# Patient Record
Sex: Male | Born: 1980 | Race: White | Hispanic: No | Marital: Single
Health system: Southern US, Community
[De-identification: ages and names within clinical notes are randomized; demographics above are authoritative.]

## PROBLEM LIST (undated history)

## (undated) DIAGNOSIS — F319 Bipolar disorder, unspecified: Secondary | ICD-10-CM

## (undated) DIAGNOSIS — M1990 Unspecified osteoarthritis, unspecified site: Secondary | ICD-10-CM

## (undated) DIAGNOSIS — I10 Essential (primary) hypertension: Secondary | ICD-10-CM

---

## 2019-05-08 ENCOUNTER — Emergency Department (HOSPITAL_COMMUNITY): Payer: Self-pay

## 2019-05-08 ENCOUNTER — Encounter (HOSPITAL_COMMUNITY): Payer: Self-pay

## 2019-05-08 ENCOUNTER — Other Ambulatory Visit: Payer: Self-pay

## 2019-05-08 ENCOUNTER — Emergency Department (HOSPITAL_COMMUNITY)
Admission: EM | Admit: 2019-05-08 | Discharge: 2019-05-09 | Disposition: A | Discharge status: Home / Self Care | Payer: Self-pay | Attending: Emergency Medicine | Admitting: Emergency Medicine

## 2019-05-08 DIAGNOSIS — Z20828 Contact with and (suspected) exposure to other viral communicable diseases: Secondary | ICD-10-CM | POA: Insufficient documentation

## 2019-05-08 DIAGNOSIS — R0789 Other chest pain: Secondary | ICD-10-CM | POA: Insufficient documentation

## 2019-05-08 DIAGNOSIS — R05 Cough: Secondary | ICD-10-CM | POA: Insufficient documentation

## 2019-05-08 DIAGNOSIS — R0602 Shortness of breath: Secondary | ICD-10-CM | POA: Insufficient documentation

## 2019-05-08 DIAGNOSIS — J329 Chronic sinusitis, unspecified: Secondary | ICD-10-CM | POA: Insufficient documentation

## 2019-05-08 DIAGNOSIS — R059 Cough, unspecified: Secondary | ICD-10-CM

## 2019-05-08 DIAGNOSIS — I10 Essential (primary) hypertension: Secondary | ICD-10-CM | POA: Insufficient documentation

## 2019-05-08 HISTORY — DX: Essential (primary) hypertension: I10

## 2019-05-08 HISTORY — DX: Bipolar disorder, unspecified: F31.9

## 2019-05-08 HISTORY — DX: Unspecified osteoarthritis, unspecified site: M19.90

## 2019-05-08 LAB — CBC
HCT: 44.6 % (ref 39.0–52.0)
Hemoglobin: 15 g/dL (ref 13.0–17.0)
MCH: 30.7 pg (ref 26.0–34.0)
MCHC: 33.6 g/dL (ref 30.0–36.0)
MCV: 91.4 fL (ref 80.0–100.0)
Platelets: 302 10*3/uL (ref 150–400)
RBC: 4.88 MIL/uL (ref 4.22–5.81)
RDW: 11.7 % (ref 11.5–15.5)
WBC: 11.3 10*3/uL — ABNORMAL HIGH (ref 4.0–10.5)
nRBC: 0 % (ref 0.0–0.2)

## 2019-05-08 LAB — BASIC METABOLIC PANEL
Anion gap: 9 (ref 5–15)
BUN: 14 mg/dL (ref 6–20)
CO2: 30 mmol/L (ref 22–32)
Calcium: 9.3 mg/dL (ref 8.9–10.3)
Chloride: 101 mmol/L (ref 98–111)
Creatinine, Ser: 1.06 mg/dL (ref 0.61–1.24)
GFR calc Af Amer: >60 mL/min (ref >60)
GFR calc non Af Amer: >60 mL/min (ref >60)
Glucose, Bld: 101 mg/dL — ABNORMAL HIGH (ref 70–99)
Potassium: 3.7 mmol/L (ref 3.5–5.1)
Sodium: 140 mmol/L (ref 135–145)

## 2019-05-08 LAB — TROPONIN I (HIGH SENSITIVITY): Troponin I (High Sensitivity): 7 ng/L (ref <18)

## 2019-05-08 MED ORDER — SODIUM CHLORIDE 0.9% FLUSH: 3.0000 mL | Freq: Once | INTRAVENOUS | Status: DC

## 2019-05-08 NOTE — ED Triage Notes (Addendum)
Patient c/o CP that hurts with palpation. Hx of asthma and EMS states pt sounds "junky". Also c/o sinus pressure with congestion.

## 2019-05-09 LAB — TROPONIN I (HIGH SENSITIVITY): Troponin I (High Sensitivity): 6 ng/L (ref <18)

## 2019-05-09 LAB — SARS CORONAVIRUS 2 (TAT 6-24 HRS): SARS Coronavirus 2: NEGATIVE

## 2019-05-09 MED ORDER — ALBUTEROL SULFATE (2.5 MG/3ML) 0.083% IN NEBU: 2.5000 mg | INHALATION_SOLUTION | Freq: Four times a day (QID) | RESPIRATORY_TRACT | 12 refills | Status: AC | PRN

## 2019-05-09 MED ORDER — AZITHROMYCIN 250 MG PO TABS: 250.0000 mg | ORAL_TABLET | Freq: Every day | ORAL | 0 refills | Status: AC

## 2019-05-09 MED ORDER — ALBUTEROL SULFATE HFA 108 (90 BASE) MCG/ACT IN AERS
2.0000 | INHALATION_SPRAY | Freq: Once | RESPIRATORY_TRACT | Status: AC
  Administered 2019-05-09: 07:00:00 2 via RESPIRATORY_TRACT
  Filled 2019-05-09: qty 6.7

## 2019-05-09 NOTE — ED Provider Notes (Signed)
[facility] EMERGENCY DEPARTMENT Provider Note   CSN: [id number] Arrival date & time: 05/08/19  2154     History   Chief Complaint Chief Complaint  Patient presents with  . Shortness of Breath    HPI Ralph Skinner is a 38 y.o. male.     The history is provided by the patient.  Shortness of Breath Severity:  Moderate Onset quality:  Gradual Timing:  Intermittent Progression:  Improving Chronicity:  New Relieved by:  None tried Worsened by:  Nothing Associated symptoms: chest pain, cough, ear pain and sore throat   Associated symptoms: no hemoptysis and no vomiting   Patient with history of asthma, arthritis, bipolar presents with cough and shortness of breath.  He reports approximately 5 days ago he began having a cough and congestion.  He also reports associated shortness of breath.  He also reports of ear pain and congestion.  He reports while at work earlier in the night, he began feeling more short of breath, feeling feverish, and had chest pain and pressure.  He reported feeling significant fatigue and weakness.  No syncope. He is a non-smoker.  Past Medical History:  Diagnosis Date  . Arthritis   . Bipolar affective ([place])   . Hypertension     There are no active problems to display for this patient.   History reviewed. No pertinent surgical history.      Home Medications    Prior to Admission medications   Not on File    Family History No family history on file.  Social History Social History   Tobacco Use  . Smoking status: Not on file  Substance Use Topics  . Alcohol use: Not on file  . Drug use: Not on file     Allergies   Penicillins and Sulfa antibiotics   Review of Systems Review of Systems  Constitutional: Positive for fatigue.  HENT: Positive for ear pain and sore throat.   Respiratory: Positive for cough and shortness of breath. Negative for hemoptysis.   Cardiovascular: Positive for chest pain. Negative for leg  swelling.  Gastrointestinal: Negative for vomiting.  All other systems reviewed and are negative.    Physical Exam Updated Vital Signs BP (!) 165/99 (BP Location: Right Arm)   Pulse (!) 56   Temp 98.1 F (36.7 C) (Oral)   Resp 11   Ht 1.854 m (6\' 1" )   Wt 117.9 kg   SpO2 98%   BMI 34.30 kg/m   Physical Exam CONSTITUTIONAL: Well developed/well nourished HEAD: Normocephalic/atraumatic EYES: EOMI/PERRL ENMT: Mucous membranes moist NECK: supple no meningeal signs SPINE/BACK:entire spine nontender CV: S1/S2 noted, no murmurs/rubs/gallops noted LUNGS: Mildly decreased breath sounds bilaterally, no crackles, no significant wheeze.  No acute distress ABDOMEN: soft, nontender, no rebound or guarding, bowel sounds noted throughout abdomen GU:no cva tenderness NEURO: Pt is awake/alert/appropriate, moves all extremitiesx4.  No facial droop.   EXTREMITIES: pulses normal/equal, full ROM, no lower extremity edema SKIN: warm, color normal PSYCH: no abnormalities of mood noted, alert and oriented to situation   ED Treatments / Results  Labs (all labs ordered are listed, but only abnormal results are displayed) Labs Reviewed  BASIC METABOLIC PANEL - Abnormal; Notable for the following components:      Result Value   Glucose, Bld 101 (*)    All other components within normal limits  CBC - Abnormal; Notable for the following components:   WBC 11.3 (*)    All other components within normal limits  NOVEL CORONAVIRUS, NAA (HOSP ORDER, SEND-OUT TO REF LAB; TAT 18-24 HRS)  TROPONIN I (HIGH SENSITIVITY)  TROPONIN I (HIGH SENSITIVITY)    EKG EKG Interpretation  Date/Time:  Saturday May 09 2019 06:05:08 EDT Ventricular Rate:  58 PR Interval:    QRS Duration: 94 QT Interval:  412 QTC Calculation: 405 R Axis:   54 Text Interpretation:  Sinus rhythm Borderline repolarization abnormality No previous ECGs available Confirmed by Zadie Rhine ([id number]) on 05/09/2019 6:24:43 AM    Radiology Dg Chest 2 View  Result Date: 05/08/2019 CLINICAL DATA:  Chest pain EXAM: CHEST - 2 VIEW COMPARISON:  None. FINDINGS: The heart size and mediastinal contours are within normal limits. Both lungs are clear. The visualized skeletal structures are unremarkable. IMPRESSION: No active cardiopulmonary disease. Electronically Signed   By: Charlett Nose M.D.   On: 05/08/2019 22:16    Procedures Procedures    Medications Ordered in ED Medications  sodium chloride flush (NS) 0.9 % injection 3 mL (has no administration in time range)  albuterol (VENTOLIN HFA) 108 (90 Base) MCG/ACT inhaler 2 puff (has no administration in time range)     Initial Impression / Assessment and Plan / ED Course  I have reviewed the triage vital signs and the nursing notes.  Pertinent labs & imaging results that were available during my care of the patient were reviewed by me and considered in my medical decision making (see chart for details).        Patient reports recent cough and congestion for several days that he felt was a sinus infection  Earlier in the night while at work at [employer] he began having increasing cough fatigue, feeling feverish and shortness of breath. Work-up in the ER was overall unremarkable.  No hypoxia. Low suspicion for ACS/PE. Chest x-ray is negative. He is appropriate for outpatient management.  Will advise albuterol, but will also add on an antibiotic in case there is any component of bacterial sinusitis We will test for COVID-19. Discussed strict ER return precautions.  Patient be given referral to local PCP as he recently located here  Ralph Skinner was evaluated in Emergency Department on 05/09/2019 for the symptoms described in the history of present illness. He was evaluated in the context of the global COVID-19 pandemic, which necessitated consideration that the patient might be at risk for infection with the SARS-CoV-2 virus that causes COVID-19. Institutional  protocols and algorithms that pertain to the evaluation of patients at risk for COVID-19 are in a state of rapid change based on information released by regulatory bodies including the CDC and federal and state organizations. These policies and algorithms were followed during the patient's care in the ED.   Final Clinical Impressions(s) / ED Diagnoses   Final diagnoses:  SOB (shortness of breath)  Cough  Sinusitis, unspecified chronicity, unspecified location    ED Discharge Orders         Ordered    albuterol (PROVENTIL) (2.5 MG/3ML) 0.083% nebulizer solution  Every 6 hours PRN     05/09/19 0632    azithromycin (ZITHROMAX) 250 MG tablet  Daily     05/09/19 9485           Zadie Rhine, MD 05/09/19 [phone]

## 2020-02-04 ENCOUNTER — Emergency Department (HOSPITAL_COMMUNITY)
Admission: EM | Admit: 2020-02-04 | Discharge: 2020-02-04 | Discharge status: Against Medical Advice | Payer: Medicaid Other

## 2020-07-30 IMAGING — CR DG CHEST 2V
2 series · 2 of 2 positions shown · non-contrast
Comparison: None.

CLINICAL DATA: Chest pain

EXAM:
CHEST - 2 VIEW

[chest pa]
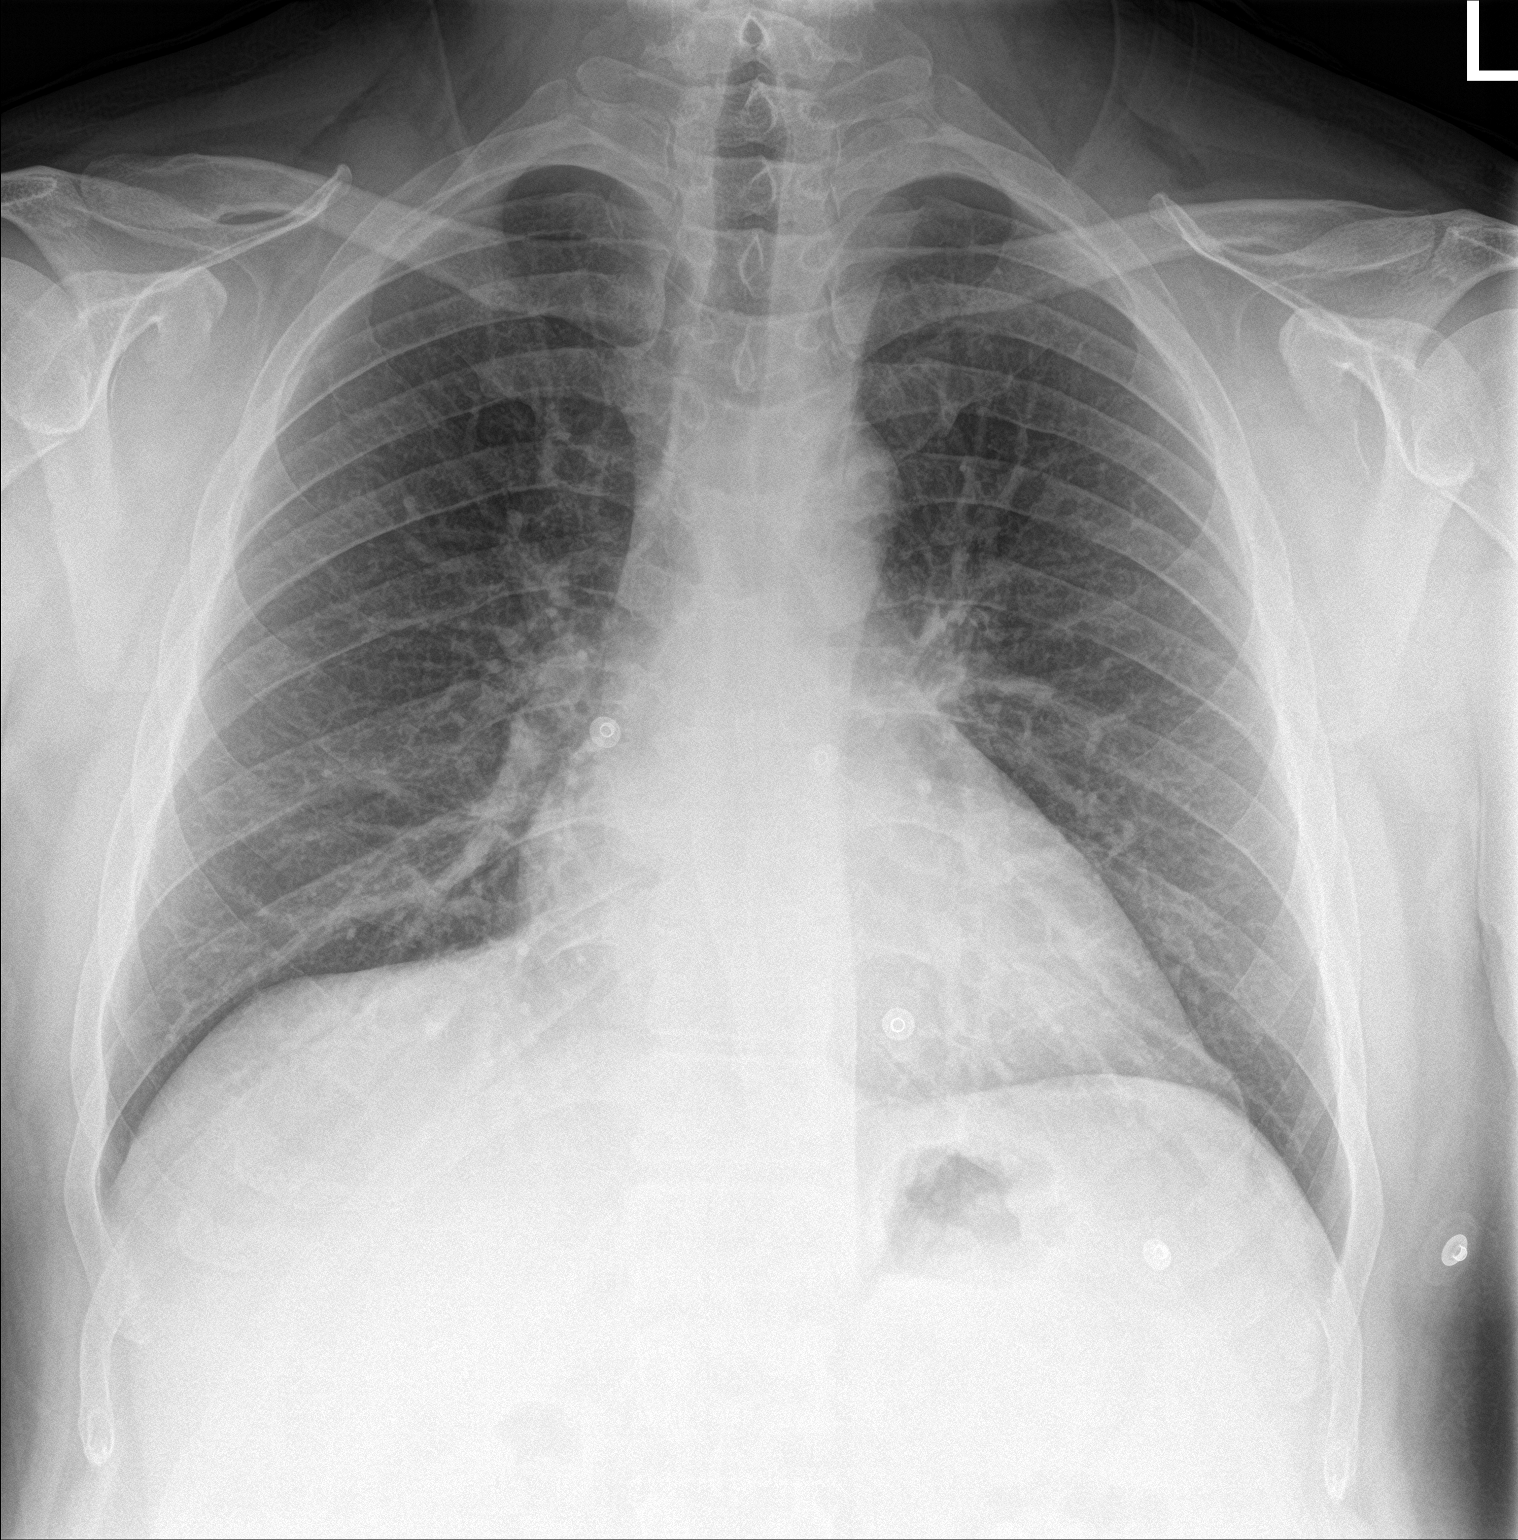

[chest lat]
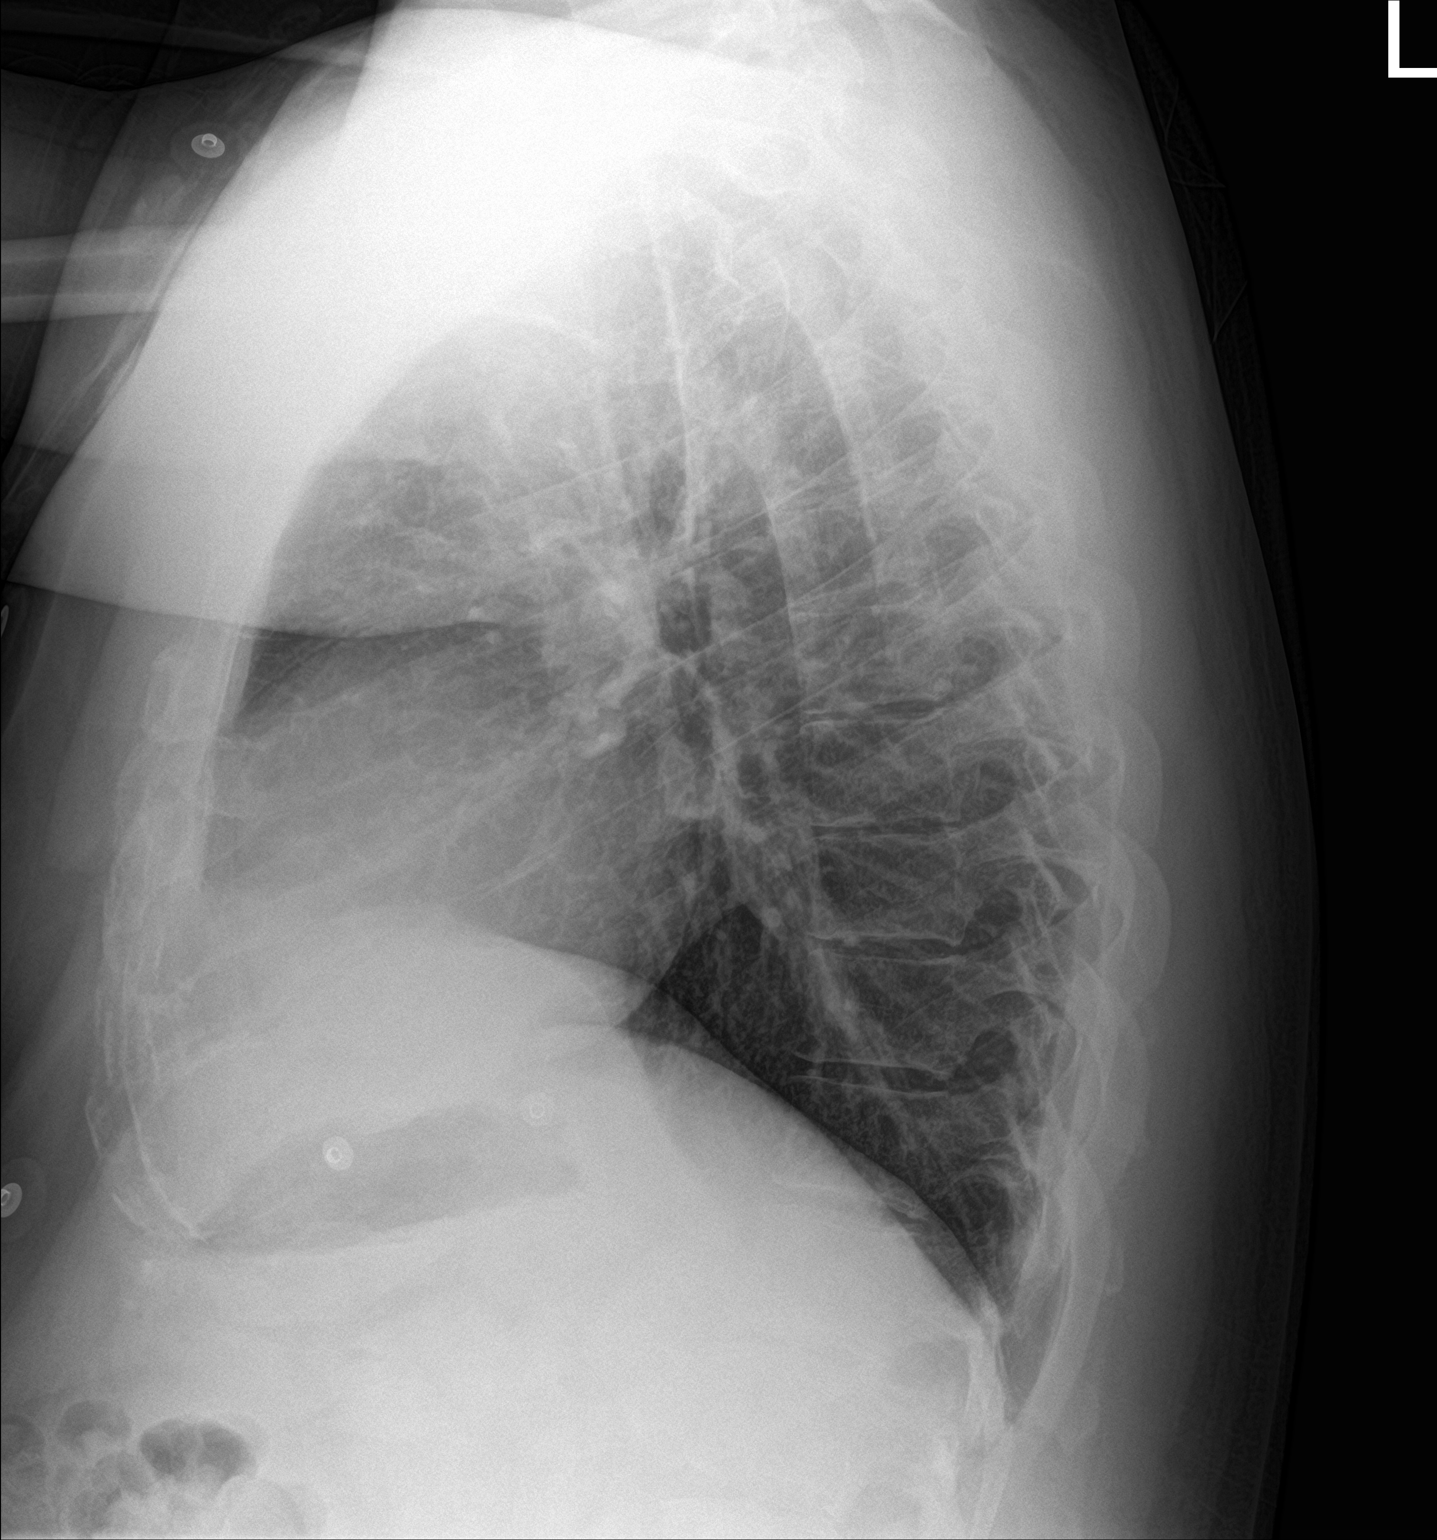

[2 of 2 positions shown; findings below may reference images not displayed]

FINDINGS: The heart size and mediastinal contours are within normal limits.
Both lungs are clear. The visualized skeletal structures are
unremarkable.
IMPRESSION: No active cardiopulmonary disease.
